# Patient Record
Sex: Male | Born: 1935 | Race: Black or African American | Hispanic: No | Smoking: Never smoker
Health system: Southern US, Community
[De-identification: ages and names within clinical notes are randomized; demographics above are authoritative.]

## PROBLEM LIST (undated history)

## (undated) DIAGNOSIS — E785 Hyperlipidemia, unspecified: Secondary | ICD-10-CM

## (undated) DIAGNOSIS — I639 Cerebral infarction, unspecified: Secondary | ICD-10-CM

## (undated) DIAGNOSIS — I10 Essential (primary) hypertension: Secondary | ICD-10-CM

---

## 2014-06-07 ENCOUNTER — Encounter (HOSPITAL_COMMUNITY): Payer: Self-pay

## 2014-06-07 ENCOUNTER — Observation Stay (HOSPITAL_COMMUNITY)
Admission: RE | Admit: 2014-06-07 | Discharge: 2014-06-08 | Disposition: A | Discharge status: Home / Self Care | Payer: Medicare Other | Source: Ambulatory Visit | Attending: Internal Medicine | Admitting: Internal Medicine

## 2014-06-07 DIAGNOSIS — Z8673 Personal history of transient ischemic attack (TIA), and cerebral infarction without residual deficits: Secondary | ICD-10-CM | POA: Insufficient documentation

## 2014-06-07 DIAGNOSIS — Z7982 Long term (current) use of aspirin: Secondary | ICD-10-CM | POA: Insufficient documentation

## 2014-06-07 DIAGNOSIS — E785 Hyperlipidemia, unspecified: Secondary | ICD-10-CM | POA: Diagnosis not present

## 2014-06-07 DIAGNOSIS — R338 Other retention of urine: Secondary | ICD-10-CM | POA: Insufficient documentation

## 2014-06-07 DIAGNOSIS — N401 Enlarged prostate with lower urinary tract symptoms: Secondary | ICD-10-CM | POA: Diagnosis not present

## 2014-06-07 DIAGNOSIS — I10 Essential (primary) hypertension: Secondary | ICD-10-CM | POA: Insufficient documentation

## 2014-06-07 DIAGNOSIS — I9589 Other hypotension: Secondary | ICD-10-CM | POA: Insufficient documentation

## 2014-06-07 DIAGNOSIS — R55 Syncope and collapse: Principal | ICD-10-CM | POA: Insufficient documentation

## 2014-06-07 HISTORY — DX: Hyperlipidemia, unspecified: E78.5

## 2014-06-07 HISTORY — DX: Cerebral infarction, unspecified: I63.9

## 2014-06-07 HISTORY — DX: Essential (primary) hypertension: I10

## 2014-06-07 LAB — CBC WITH DIFFERENTIAL/PLATELET
Basophils Absolute: 0 10*3/uL (ref 0.0–0.1)
Basophils Relative: 0 % (ref 0–1)
EOS ABS: 0.1 10*3/uL (ref 0.0–0.7)
Eosinophils Relative: 1 % (ref 0–5)
HEMATOCRIT: 34.2 % — AB (ref 39.0–52.0)
HEMOGLOBIN: 11.3 g/dL — AB (ref 13.0–17.0)
LYMPHS ABS: 1.6 10*3/uL (ref 0.7–4.0)
Lymphocytes Relative: 22 % (ref 12–46)
MCH: 28.3 pg (ref 26.0–34.0)
MCHC: 33 g/dL (ref 30.0–36.0)
MCV: 85.7 fL (ref 78.0–100.0)
MONO ABS: 0.4 10*3/uL (ref 0.1–1.0)
MONOS PCT: 5 % (ref 3–12)
NEUTROS PCT: 72 % (ref 43–77)
Neutro Abs: 5 10*3/uL (ref 1.7–7.7)
Platelets: 137 10*3/uL — ABNORMAL LOW (ref 150–400)
RBC: 3.99 MIL/uL — AB (ref 4.22–5.81)
RDW: 14.5 % (ref 11.5–15.5)
WBC: 7.1 10*3/uL (ref 4.0–10.5)

## 2014-06-07 LAB — I-STAT CHEM 8, ED
BUN: 24 mg/dL — AB (ref 6–20)
CALCIUM ION: 1.18 mmol/L (ref 1.13–1.30)
CREATININE: 1 mg/dL (ref 0.61–1.24)
Chloride: 103 mmol/L (ref 101–111)
Glucose, Bld: 139 mg/dL — ABNORMAL HIGH (ref 70–99)
HCT: 39 % (ref 39.0–52.0)
Hemoglobin: 13.3 g/dL (ref 13.0–17.0)
Potassium: 3.6 mmol/L (ref 3.5–5.1)
Sodium: 142 mmol/L (ref 135–145)
TCO2: 22 mmol/L (ref 0–100)

## 2014-06-07 LAB — I-STAT CG4 LACTIC ACID, ED: LACTIC ACID, VENOUS: 2.27 mmol/L — AB (ref 0.5–2.0)

## 2014-06-07 NOTE — ED Notes (Signed)
Unresp, vomited became alert (vomiting after attempting placement of king), 12 lead shows Lbbb, activated stemi, bp 90 initially pale diaphoretic cool to touch, 93.9 temp with ems.

## 2014-06-07 NOTE — ED Notes (Signed)
EKG not crossing over. EKG handed to Dr.

## 2014-06-07 NOTE — ED Provider Notes (Signed)
CSN: [id number]     Arrival date & time 06/07/14  2257 History   None    Chief Complaint  Patient presents with  . Chest Pain     (Consider location/radiation/quality/duration/timing/severity/associated sxs/prior Treatment) HPI 81 yo male presents to the ER via EMS from home as code stemi due to EKG.  Code STEMI cancelled upon arrival.  Per pt and EMS, pt had unresponsive episode.  Pt reports he had finished dinner and was walking back to his room when he felt unwell.  Pt reports he was unable to see or respond, but could hear his daughter.  EMS reports upon their arrival pt was unresponsive, diaphoretic, cool to touch, low bp, had near apnea.  Pt did not respond to bagging, but became responsive with attempted king airway placement, had vomiting.  Pt denies chest pain.  Pt much more responsive in route, currently without complaint.  Pt reports h/o diabetes, htn, bladder troubles.  He reports [facility]  He denies prior heart attack.  He denies recent illlness, fevers, n/v/d. Past Medical History  Diagnosis Date  . Hyperlipemia   . Hypertension   . Stroke    History reviewed. No pertinent past surgical history. History reviewed. No pertinent family history. History  Substance Use Topics  . Smoking status: Not on file  . Smokeless tobacco: Not on file  . Alcohol Use: Not on file    Review of Systems  Neurological: Positive for syncope.  All other systems reviewed and are negative.     Allergies  Review of patient's allergies indicates no known allergies.  Home Medications   Prior to Admission medications   Not on File   Ht 5\' 4"  (1.626 m)  Wt 128 lb (58.06 kg)  BMI 21.96 kg/m2 Physical Exam  Constitutional: He is oriented to person, place, and time. He appears well-developed and well-nourished. No distress.  HENT:  Head: Normocephalic and atraumatic.  Nose: Nose normal.  Mouth/Throat: Oropharynx is clear and moist.  Eyes: Conjunctivae and  EOM are normal. Pupils are equal, round, and reactive to light.  Neck: Normal range of motion. Neck supple. No JVD present. No tracheal deviation present. No thyromegaly present.  Cardiovascular: Normal rate, regular rhythm, normal heart sounds and intact distal pulses.  Exam reveals no gallop and no friction rub.   No murmur heard. Pulmonary/Chest: Effort normal and breath sounds normal. No stridor. No respiratory distress. He has no wheezes. He has no rales. He exhibits no tenderness.  Abdominal: Soft. Bowel sounds are normal. He exhibits no distension and no mass. There is no tenderness. There is no rebound and no guarding.  Musculoskeletal: Normal range of motion. He exhibits no edema or tenderness.  Lymphadenopathy:    He has no cervical adenopathy.  Neurological: He is alert and oriented to person, place, and time. He displays normal reflexes. He exhibits normal muscle tone. Coordination normal.  Skin: Skin is warm and dry. No rash noted. No erythema. No pallor.  Psychiatric: He has a normal mood and affect. His behavior is normal. Judgment and thought content normal.  Nursing note and vitals reviewed.   ED Course  Procedures (including critical care time) Labs Review Labs Reviewed  COMPREHENSIVE METABOLIC PANEL - Abnormal; Notable for the following:    Potassium 3.4 (*)    Glucose, Bld 140 (*)    BUN 21 (*)    Total Protein 6.4 (*)    Albumin 3.3 (*)    ALT  8 (*)    All other components within normal limits  CBC WITH DIFFERENTIAL/PLATELET - Abnormal; Notable for the following:    RBC 3.99 (*)    Hemoglobin 11.3 (*)    HCT 34.2 (*)    Platelets 137 (*)    All other components within normal limits  I-STAT CG4 LACTIC ACID, ED - Abnormal; Notable for the following:    Lactic Acid, Venous 2.27 (*)    All other components within normal limits  I-STAT CHEM 8, ED - Abnormal; Notable for the following:    BUN 24 (*)    Glucose, Bld 139 (*)    All other components within normal  limits  TROPONIN I  URINALYSIS, ROUTINE W REFLEX MICROSCOPIC  BASIC METABOLIC PANEL    Imaging Review Dg Chest Port 1 View  06/08/2014   CLINICAL DATA:  Syncope, dyspnea.  EXAM: PORTABLE CHEST - 1 VIEW  COMPARISON:  None.  FINDINGS: A single AP portable view of the chest demonstrates no focal airspace consolidation or alveolar edema. The lungs are grossly clear. There is no large effusion or pneumothorax. Cardiac and mediastinal contours appear unremarkable.  IMPRESSION: No active disease.   Electronically Signed   By: Ellery Plunkaniel R Mitchell M.D.   On: 06/08/2014 01:49     EKG Interpretation   Date/Time:  Friday Jun 07 2014 23:14:12 EDT Ventricular Rate:  75 PR Interval:  220 QRS Duration: 150 QT Interval:  470 QTC Calculation: 524 R Axis:   83 Text Interpretation:  Sinus rhythm with 1st degree A-V block Left  ventricular hypertrophy with QRS widening and repolarization abnormality  Abnormal ECG No old tracing to compare Confirmed by Akon Reinoso  MD, Mariafernanda Hendricksen  ([id number]) on 06/07/2014 11:16:26 PM      MDM   Final diagnoses:  Syncope, unspecified syncope type    81 yo male with unresponsive episode, now back to baseline.  Ddx includes syncope, seizure, TIA, arrhythmia.  Plan for labs, ekg, further monitoring  2:21 AM Pt to be admitted.  Per family, pt with prolonged unresponsive episode after dinner.  [facility], started on flomax for BPH, had admit for pna 2 months ago.  Recently started on lisinopril for htn.  Low bps here, but responding to fluids.    Marisa Severinlga Lannie Yusuf, MD 06/08/14 Earle [id number]

## 2014-06-07 NOTE — ED Notes (Signed)
The pts family report a syncopal  Episode just before bedtime.  [facility].  He just became weak while walking to the bedroom.  His eyes rolled backward and he was noit responding and sweating.  He responded when ems arrived and attempted to intubate him  He vomited then started talking.  Alert skin warm and dry.  No pain anywhere.  C/o being cold warm blankets  placed

## 2014-06-08 ENCOUNTER — Ambulatory Visit (HOSPITAL_COMMUNITY): Payer: Medicare Other

## 2014-06-08 DIAGNOSIS — I9589 Other hypotension: Secondary | ICD-10-CM | POA: Diagnosis not present

## 2014-06-08 DIAGNOSIS — R55 Syncope and collapse: Secondary | ICD-10-CM | POA: Diagnosis present

## 2014-06-08 DIAGNOSIS — I10 Essential (primary) hypertension: Secondary | ICD-10-CM | POA: Diagnosis not present

## 2014-06-08 LAB — BASIC METABOLIC PANEL
ANION GAP: 5 (ref 5–15)
BUN: 20 mg/dL (ref 6–20)
CO2: 27 mmol/L (ref 22–32)
Calcium: 8.7 mg/dL — ABNORMAL LOW (ref 8.9–10.3)
Chloride: 108 mmol/L (ref 101–111)
Creatinine, Ser: 1 mg/dL (ref 0.61–1.24)
GFR calc Af Amer: >60 mL/min (ref >60)
Glucose, Bld: 108 mg/dL — ABNORMAL HIGH (ref 70–99)
Potassium: 3.9 mmol/L (ref 3.5–5.1)
Sodium: 140 mmol/L (ref 135–145)

## 2014-06-08 LAB — COMPREHENSIVE METABOLIC PANEL
ALK PHOS: 62 U/L (ref 38–126)
ALT: 8 U/L — ABNORMAL LOW (ref 17–63)
ANION GAP: 10 (ref 5–15)
AST: 22 U/L (ref 15–41)
Albumin: 3.3 g/dL — ABNORMAL LOW (ref 3.5–5.0)
BUN: 21 mg/dL — AB (ref 6–20)
CHLORIDE: 103 mmol/L (ref 101–111)
CO2: 24 mmol/L (ref 22–32)
CREATININE: 1.04 mg/dL (ref 0.61–1.24)
Calcium: 9.2 mg/dL (ref 8.9–10.3)
GFR calc Af Amer: >60 mL/min (ref >60)
GFR calc non Af Amer: >60 mL/min (ref >60)
GLUCOSE: 140 mg/dL — AB (ref 70–99)
POTASSIUM: 3.4 mmol/L — AB (ref 3.5–5.1)
Sodium: 137 mmol/L (ref 135–145)
Total Bilirubin: 0.6 mg/dL (ref 0.3–1.2)
Total Protein: 6.4 g/dL — ABNORMAL LOW (ref 6.5–8.1)

## 2014-06-08 LAB — URINALYSIS, ROUTINE W REFLEX MICROSCOPIC
Bilirubin Urine: NEGATIVE
Glucose, UA: NEGATIVE mg/dL
HGB URINE DIPSTICK: NEGATIVE
Ketones, ur: 15 mg/dL — AB
Leukocytes, UA: NEGATIVE
NITRITE: NEGATIVE
PROTEIN: NEGATIVE mg/dL
Specific Gravity, Urine: 1.024 (ref 1.005–1.030)
UROBILINOGEN UA: 0.2 mg/dL (ref 0.0–1.0)
pH: 5.5 (ref 5.0–8.0)

## 2014-06-08 LAB — TROPONIN I: Troponin I: 0.03 ng/mL (ref <0.031)

## 2014-06-08 MED ORDER — PANTOPRAZOLE SODIUM 40 MG PO TBEC: 40.0000 mg | DELAYED_RELEASE_TABLET | Freq: Every day | ORAL | Status: DC

## 2014-06-08 MED ORDER — DOCUSATE SODIUM 100 MG PO CAPS: 200.0000 mg | ORAL_CAPSULE | Freq: Two times a day (BID) | ORAL | Status: DC

## 2014-06-08 MED ORDER — LISINOPRIL 20 MG PO TABS
20.0000 mg | ORAL_TABLET | Freq: Every day | ORAL | Status: DC
  Filled 2014-06-08: qty 1

## 2014-06-08 MED ORDER — LORATADINE 10 MG PO TABS
10.0000 mg | ORAL_TABLET | Freq: Every day | ORAL | Status: DC
  Filled 2014-06-08: qty 1

## 2014-06-08 MED ORDER — BISACODYL 5 MG PO TBEC
5.0000 mg | DELAYED_RELEASE_TABLET | Freq: Every day | ORAL | Status: DC
  Filled 2014-06-08: qty 1

## 2014-06-08 MED ORDER — MIRTAZAPINE 15 MG PO TABS
15.0000 mg | ORAL_TABLET | Freq: Every day | ORAL | Status: DC
  Filled 2014-06-08: qty 1

## 2014-06-08 MED ORDER — ADULT MULTIVITAMIN W/MINERALS CH
1.0000 | ORAL_TABLET | Freq: Every day | ORAL | Status: DC
  Filled 2014-06-08: qty 1

## 2014-06-08 MED ORDER — SODIUM CHLORIDE 0.9 % IV BOLUS (SEPSIS)
1000.0000 mL | Freq: Once | INTRAVENOUS | Status: AC
  Administered 2014-06-08: 1000 mL via INTRAVENOUS

## 2014-06-08 MED ORDER — SODIUM CHLORIDE 0.9 % IJ SOLN: 3.0000 mL | Freq: Two times a day (BID) | INTRAMUSCULAR | Status: DC

## 2014-06-08 MED ORDER — ASPIRIN 325 MG PO TABS
325.0000 mg | ORAL_TABLET | Freq: Every day | ORAL | Status: DC
  Filled 2014-06-08: qty 1

## 2014-06-08 MED ORDER — ONDANSETRON HCL 4 MG/2ML IJ SOLN
4.0000 mg | Freq: Once | INTRAMUSCULAR | Status: AC
  Administered 2014-06-08: 4 mg via INTRAVENOUS
  Filled 2014-06-08: qty 2

## 2014-06-08 MED ORDER — ACETAMINOPHEN 325 MG PO TABS: 650.0000 mg | ORAL_TABLET | Freq: Every day | ORAL | Status: DC

## 2014-06-08 MED ORDER — HEPARIN SODIUM (PORCINE) 5000 UNIT/ML IJ SOLN
5000.0000 [IU] | Freq: Three times a day (TID) | INTRAMUSCULAR | Status: DC
  Administered 2014-06-08: 5000 [IU] via SUBCUTANEOUS

## 2014-06-08 NOTE — H&P (Signed)
[facility] History and Physical  Raymond GuerinWilliam Estis ZOX:[id number] DOB: 05/28/1920 DOA: 06/07/2014  Referring physician: EDP PCP: No primary care provider on file.   Chief Complaint: Syncope   HPI: Raymond GuerinWilliam Davis is a 81 y.o. male who spent the entire day today [facility].  He was started on Terazosin today for BPH and urinary retention.  He took the medication this evening, and 2 hours later, became light headed, sweaty, and passed out at home.  EMS arrived, he was initially unresponsive, they tried to intubate him, he woke up during intubation attempt, vomited, and became responsive and awake, alert, and talking.  He was brought to the ED, where his BP was found to be very low, and has remained persistently ~ 88-90 systolic / 50 diastolic.  Review of Systems: Systems reviewed.  As above, otherwise negative  Past Medical History  Diagnosis Date  . Hyperlipemia   . Hypertension   . Stroke    History reviewed. No pertinent past surgical history. Social History:  has no tobacco, alcohol, and drug history on file.  No Known Allergies  History reviewed. No pertinent family history.   Prior to Admission medications   Medication Sig Start Date End Date Taking? Authorizing Provider  acetaminophen (TYLENOL) 325 MG tablet Take 650 mg by mouth daily.   Yes Historical Provider, MD  aspirin 325 MG tablet Take 325 mg by mouth daily.   Yes Historical Provider, MD  bisacodyl (DULCOLAX) 5 MG EC tablet Take 5 mg by mouth daily.   Yes Historical Provider, MD  docusate sodium (COLACE) 100 MG capsule Take 200 mg by mouth 2 (two) times daily.   Yes Historical Provider, MD  lisinopril (PRINIVIL,ZESTRIL) 40 MG tablet Take 20 mg by mouth daily.   Yes Historical Provider, MD  mirtazapine (REMERON) 15 MG tablet Take 15 mg by mouth at bedtime.   Yes Historical Provider, MD  Multiple Vitamin (MULTIVITAMIN WITH MINERALS) TABS tablet Take 1 tablet by mouth daily.   Yes Historical  Provider, MD  omeprazole (PRILOSEC) 20 MG capsule Take 20 mg by mouth daily.   Yes Historical Provider, MD   Physical Exam: Filed Vitals:   06/08/14 0045  BP: 88/63  Pulse: 80  Temp:   Resp: 13    BP 88/63 mmHg  Pulse 80  Temp(Src) 97.3 F (36.3 C) (Oral)  Resp 13  Ht 5\' 4"  (1.626 m)  Wt 58.06 kg (128 lb)  BMI 21.96 kg/m2  SpO2 100%  General Appearance:    Alert, oriented, no distress, appears stated age  Head:    Normocephalic, atraumatic  Eyes:    PERRL, EOMI, sclera non-icteric        Nose:   Nares without drainage or epistaxis. Mucosa, turbinates normal  Throat:   Moist mucous membranes. Oropharynx without erythema or exudate.  Neck:   Supple. No carotid bruits.  No thyromegaly.  No lymphadenopathy.   Back:     No CVA tenderness, no spinal tenderness  Lungs:     Clear to auscultation bilaterally, without wheezes, rhonchi or rales  Chest wall:    No tenderness to palpitation  Heart:    Regular rate and rhythm without murmurs, gallops, rubs  Abdomen:     Soft, non-tender, nondistended, normal bowel sounds, no organomegaly  Genitalia:    deferred  Rectal:    deferred  Extremities:   No clubbing, cyanosis or edema.  Pulses:   2+ and symmetric all extremities  Skin:  Skin color, texture, turgor normal, no rashes or lesions  Lymph nodes:   Cervical, supraclavicular, and axillary nodes normal  Neurologic:   CNII-XII intact. Normal strength, sensation and reflexes      throughout    Labs on Admission:  Basic Metabolic Panel:  Recent Labs Lab 06/07/14 2326 06/07/14 2334  NA 137 142  K 3.4* 3.6  CL 103 103  CO2 24  --   GLUCOSE 140* 139*  BUN 21* 24*  CREATININE 1.04 1.00  CALCIUM 9.2  --    Liver Function Tests:  Recent Labs Lab 06/07/14 2326  AST 22  ALT 8*  ALKPHOS 62  BILITOT 0.6  PROT 6.4*  ALBUMIN 3.3*   No results for input(s): LIPASE, AMYLASE in the last 168 hours. No results for input(s): AMMONIA in the last 168 hours. CBC:  Recent  Labs Lab 06/07/14 2326 06/07/14 2334  WBC 7.1  --   NEUTROABS 5.0  --   HGB 11.3* 13.3  HCT 34.2* 39.0  MCV 85.7  --   PLT 137*  --    Cardiac Enzymes:  Recent Labs Lab 06/07/14 2326  TROPONINI 0.03    BNP (last 3 results) No results for input(s): PROBNP in the last 8760 hours. CBG: No results for input(s): GLUCAP in the last 168 hours.  Radiological Exams on Admission: Dg Chest Port 1 View  06/08/2014   CLINICAL DATA:  Syncope, dyspnea.  EXAM: PORTABLE CHEST - 1 VIEW  COMPARISON:  None.  FINDINGS: A single AP portable view of the chest demonstrates no focal airspace consolidation or alveolar edema. The lungs are grossly clear. There is no large effusion or pneumothorax. Cardiac and mediastinal contours appear unremarkable.  IMPRESSION: No active disease.   Electronically Signed   By: Ellery Plunkaniel R Mitchell M.D.   On: 06/08/2014 01:49    EKG: Independently reviewed.  Assessment/Plan Principal Problem:   Doxazosin-induced syncope Active Problems:   Iatrogenic hypotension   HTN (hypertension)   1. Terazosin-induced syncope - Terazosin prescribed to patient to attempt to treat BPH, unfortunatly after taking first pill earlier this evening, has had syncopal episode and is persistently hypotensive (normally hypertensive at baseline requiring max dose lisinopril at baseline). 1. Getting IVF in ED, goal MAP >= 65. 2. Discontinuing terazosin and holding lisinopril 3. If alpha blocker is desired in future, will need to stop lisinopril first and use a lower dose of alpha blocker with extreme caution. 4. Repeat BMP in AM to monitor kidney function in light of hypotension. 5. Strict intake and output 2. HTN - presently hypotensive, stopping BP meds    Code Status: Full Code  Family Communication: Family at bedside Disposition Plan: Admit to obs   Time spent: 70 min  Gordon Carlson M. [facility] Pager [phone]  If 7AM-7PM, please contact the day team taking care of the  patient [email or web address] Password [email or web address] 06/08/2014, 1:51 AM

## 2014-06-08 NOTE — Progress Notes (Signed)
Discharged to home with family office visits in place teaching done  

## 2014-06-08 NOTE — ED Notes (Signed)
Report given to

## 2014-06-08 NOTE — Progress Notes (Signed)
Pt. Walked 26200ft. tolerated well

## 2014-06-08 NOTE — ED Notes (Signed)
Admitting doctor  Seeing the pt

## 2014-06-08 NOTE — ED Notes (Signed)
Pt c/o nausea. Med given. 

## 2014-06-08 NOTE — Discharge Summary (Addendum)
Physician Discharge Summary  Raymond GuerinWilliam Deroos ZOX:[id number] DOB: 04/25/1920 DOA: 06/07/2014  PCP: No PCP Per Patient  Admit date: 06/07/2014 Discharge date: 06/08/2014  Time spent: 50 minutes  Recommendations for Outpatient Follow-up:  [facility]. [facility]  Discharge Condition: Stable Diet recommendation: Sodium heart healthy  Discharge Diagnoses:  Principal Problem:   Syncope and collapse Active Problems:   Iatrogenic hypotension   HTN (hypertension)   History of present illness:  Raymond Davis is a 81 y.o. male who spent the entire day today getting routine tests done at the [facility]. He was started on Terazosin  for BPH.Marland Kitchen. He took the medication on the evening of admission and 2 hours later, became light headed, sweaty, and passed out at home. EMS arrived, he was initially unresponsive, they tried to intubate him, he woke up during intubation attempt, vomited, and became responsive and awake, alert, and talking. He was brought to the ED, where his BP was found to be very low, and has remained persistently ~ 88-90 systolic / 50 diastolic.  Hospital Course:  Syncope, orthostatic hypotension- history of hypertension -In relation to above-mentioned use of terazosin-blood pressure this morning is actually elevated and orthostatic vitals are negative -Lab work is consistent with dehydration with an elevated BUN and creatinine ratio-he has received close to 2 L of fluids since admission and is voiding well-as mentioned his orthostatic vitals are negative and at this point I do not feel he needs any further hydration -He and his daughter who is at bedside are recommended to discontinue Terazosin completely and follow up at the [facility] to readdress his BPH issue- his daughter is aware that she will need to take records of this hospital stay with her when she follows up - lisinopril has been resumed  Procedures:  none  Consultations:  none  Discharge Exam: Filed Weights    06/07/14 2310 06/08/14 0526  Weight: 58.06 kg (128 lb) 53.7 kg (118 lb 6.2 oz)   Filed Vitals:   06/08/14 0526  BP: 145/59  Pulse: 64  Temp: 97.4 F (36.3 C)  Resp: 20    General: AAO x 3, no distress Cardiovascular: RRR, no murmurs  Respiratory: clear to auscultation bilaterally GI: soft, non-tender, non-distended, bowel sound positive  Discharge Instructions You were cared for by a hospitalist during your hospital stay. If you have any questions about your discharge medications or the care you received while you were in the hospital after you are discharged, you can call the unit and asked to speak with the hospitalist on call if the hospitalist that took care of you is not available. Once you are discharged, your primary care physician will handle any further medical issues. Please note that NO REFILLS for any discharge medications will be authorized once you are discharged, as it is imperative that you return to your primary care physician (or establish a relationship with a primary care physician if you do not have one) for your aftercare needs so that they can reassess your need for medications and monitor your lab values.      Discharge Instructions    Diet - low sodium heart healthy    Complete by:  As directed      Increase activity slowly    Complete by:  As directed             Medication List    TAKE these medications        acetaminophen 325 MG tablet  Commonly known as:  TYLENOL  Take 650 mg by mouth daily.     aspirin 325 MG tablet  Take 325 mg by mouth daily.     bisacodyl 5 MG EC tablet  Commonly known as:  DULCOLAX  Take 5 mg by mouth daily.     docusate sodium 100 MG capsule  Commonly known as:  COLACE  Take 200 mg by mouth 2 (two) times daily.     lisinopril 40 MG tablet  Commonly known as:  PRINIVIL,ZESTRIL  Take 20 mg by mouth daily.     mirtazapine 15 MG tablet  Commonly known as:  REMERON  Take 15 mg by mouth at bedtime.      multivitamin with minerals Tabs tablet  Take 1 tablet by mouth daily.     omeprazole 20 MG capsule  Commonly known as:  PRILOSEC  Take 20 mg by mouth daily.       Allergies  Allergen Reactions  . Terazosin Other (See Comments)    Patient had syncopal episode and persistent hypotension after starting 0.4mg  terazosin for first time.  Use extreme caution if trying to use alpha blocker in future and stop other BP meds first.      The results of significant diagnostics from this hospitalization (including imaging, microbiology, ancillary and laboratory) are listed below for reference.    Significant Diagnostic Studies: Dg Chest Port 1 View  06/08/2014   CLINICAL DATA:  Syncope, dyspnea.  EXAM: PORTABLE CHEST - 1 VIEW  COMPARISON:  None.  FINDINGS: A single AP portable view of the chest demonstrates no focal airspace consolidation or alveolar edema. The lungs are grossly clear. There is no large effusion or pneumothorax. Cardiac and mediastinal contours appear unremarkable.  IMPRESSION: No active disease.   Electronically Signed   By: Ellery Plunkaniel R Mitchell M.D.   On: 06/08/2014 01:49    Microbiology: No results found for this or any previous visit (from the past 240 hour(s)).   Labs: Basic Metabolic Panel:  Recent Labs Lab 06/07/14 2326 06/07/14 2334 06/08/14 0343  NA 137 142 140  K 3.4* 3.6 3.9  CL 103 103 108  CO2 24  --  27  GLUCOSE 140* 139* 108*  BUN 21* 24* 20  CREATININE 1.04 1.00 1.00  CALCIUM 9.2  --  8.7*   Liver Function Tests:  Recent Labs Lab 06/07/14 2326  AST 22  ALT 8*  ALKPHOS 62  BILITOT 0.6  PROT 6.4*  ALBUMIN 3.3*   No results for input(s): LIPASE, AMYLASE in the last 168 hours. No results for input(s): AMMONIA in the last 168 hours. CBC:  Recent Labs Lab 06/07/14 2326 06/07/14 2334  WBC 7.1  --   NEUTROABS 5.0  --   HGB 11.3* 13.3  HCT 34.2* 39.0  MCV 85.7  --   PLT 137*  --    Cardiac Enzymes:  Recent Labs Lab 06/07/14 2326   TROPONINI 0.03   BNP: BNP (last 3 results) No results for input(s): BNP in the last 8760 hours.  ProBNP (last 3 results) No results for input(s): PROBNP in the last 8760 hours.  CBG: No results for input(s): GLUCAP in the last 168 hours.     SignedCalvert Cantor:  Alane Hanssen, MD [facility] 06/08/2014, 12:33 PM

## 2015-02-04 ENCOUNTER — Encounter: Payer: Self-pay | Admitting: Sports Medicine

## 2015-02-04 ENCOUNTER — Ambulatory Visit (INDEPENDENT_AMBULATORY_CARE_PROVIDER_SITE_OTHER): Payer: Medicare Other | Admitting: Sports Medicine

## 2015-02-04 DIAGNOSIS — M79672 Pain in left foot: Secondary | ICD-10-CM | POA: Diagnosis not present

## 2015-02-04 DIAGNOSIS — B351 Tinea unguium: Secondary | ICD-10-CM | POA: Diagnosis not present

## 2015-02-04 DIAGNOSIS — M79671 Pain in right foot: Secondary | ICD-10-CM | POA: Diagnosis not present

## 2015-02-04 DIAGNOSIS — M2040 Other hammer toe(s) (acquired), unspecified foot: Secondary | ICD-10-CM | POA: Diagnosis not present

## 2015-02-04 DIAGNOSIS — M21619 Bunion of unspecified foot: Secondary | ICD-10-CM

## 2015-02-04 NOTE — Progress Notes (Signed)
Patient ID: Raymond Davis, male   DOB: 06/04/1920, 82 y.o.   MRN: [id number] Subjective: Raymond Davis is a 82 y.o. male patient seen today in office with complaint of painful thickened and elongated toenails; unable to trim. Patient is assisted by son who denies history of Diabetes, Neuropathy, or Vascular disease. Admits to previous right sided stroke in past. Patient has no other pedal complaints at this time.   Patient Active Problem List   Diagnosis Date Noted  . Iatrogenic hypotension 06/08/2014  . Syncope and collapse 06/08/2014  . HTN (hypertension) 06/08/2014   Current Outpatient Prescriptions on File Prior to Visit  Medication Sig Dispense Refill  . acetaminophen (TYLENOL) 325 MG tablet Take 650 mg by mouth daily.    Marland Kitchen. aspirin 325 MG tablet Take 325 mg by mouth daily.    . bisacodyl (DULCOLAX) 5 MG EC tablet Take 5 mg by mouth daily.    Marland Kitchen. docusate sodium (COLACE) 100 MG capsule Take 200 mg by mouth 2 (two) times daily.    Marland Kitchen. lisinopril (PRINIVIL,ZESTRIL) 40 MG tablet Take 20 mg by mouth daily.    . mirtazapine (REMERON) 15 MG tablet Take 15 mg by mouth at bedtime.    . Multiple Vitamin (MULTIVITAMIN WITH MINERALS) TABS tablet Take 1 tablet by mouth daily.    Marland Kitchen. omeprazole (PRILOSEC) 20 MG capsule Take 20 mg by mouth daily.     No current facility-administered medications on file prior to visit.   Allergies  Allergen Reactions  . Terazosin Other (See Comments)    Patient had syncopal episode and persistent hypotension after starting 0.4mg  terazosin for first time.  Use extreme caution if trying to use alpha blocker in future and stop other BP meds first.   Objective: Physical Exam  General: Well developed, nourished, no acute distress, awake, alert and oriented x 3  Vascular: Dorsalis pedis artery 1/4 bilateral, Posterior tibial artery 1/4 bilateral, skin temperature warm to warm proximal to distal bilateral lower extremities, no varicosities, scant pedal hair present  bilateral.  Neurological: Gross sensation present via light touch bilateral. Decreased vibratory and protective with SWMF bilateral.  Dermatological: Skin is warm, dry, and supple bilateral, Nails 1-10 are tender, long, thick, and discolored with mild subungal debris, no webspace macerations present bilateral, no open lesions present bilateral, no callus/corns/hyperkeratotic tissue present bilateral. No signs of infection bilateral.  Musculoskeletal:Asymptoatic bunion and hammertoe deformities noted bilateral. Muscular strength within normal limits without gross pain or major limitation on range of motion. No pain with calf compression bilateral.  Assessment and Plan:  Problem List Items Addressed This Visit    None    Visit Diagnoses    Dermatophytosis of nail    -  Primary    Foot pain, bilateral        Bunion        Hammer toe, unspecified laterality          -Examined patient.  -Discussed treatment options for painful mycotic nails. -Mechanically debrided and reduced mycotic nails with sterile nail nipper and dremel nail file without incident. -Recommend good supportive shoes daily for foot type and gentle toe stretches as needed -Patient to return in 3 months for follow up evaluation or sooner if symptoms worsen.  Asencion Islamitorya Shanta Dorvil, DPM

## 2015-05-06 ENCOUNTER — Ambulatory Visit: Payer: Medicare Other | Admitting: Sports Medicine

## 2015-05-09 ENCOUNTER — Ambulatory Visit (INDEPENDENT_AMBULATORY_CARE_PROVIDER_SITE_OTHER): Payer: Medicare Other | Admitting: Sports Medicine

## 2015-05-09 ENCOUNTER — Encounter: Payer: Self-pay | Admitting: Sports Medicine

## 2015-05-09 DIAGNOSIS — M79671 Pain in right foot: Secondary | ICD-10-CM | POA: Diagnosis not present

## 2015-05-09 DIAGNOSIS — M21619 Bunion of unspecified foot: Secondary | ICD-10-CM | POA: Diagnosis not present

## 2015-05-09 DIAGNOSIS — G629 Polyneuropathy, unspecified: Secondary | ICD-10-CM

## 2015-05-09 DIAGNOSIS — B351 Tinea unguium: Secondary | ICD-10-CM | POA: Diagnosis not present

## 2015-05-09 DIAGNOSIS — M79672 Pain in left foot: Secondary | ICD-10-CM

## 2015-05-09 DIAGNOSIS — M2040 Other hammer toe(s) (acquired), unspecified foot: Secondary | ICD-10-CM | POA: Diagnosis not present

## 2015-05-09 DIAGNOSIS — I739 Peripheral vascular disease, unspecified: Secondary | ICD-10-CM

## 2015-05-09 NOTE — Progress Notes (Signed)
Patient ID: Raymond Davis, male   DOB: 02/20/1920, 82 y.o.   MRN: [id number]  Subjective: Raymond Davis is a 82 y.o. male patient seen today in office with complaint of painful thickened and elongated toenails; unable to trim. Patient denies any changes since last visit. Patient has no other pedal complaints at this time.   Patient Active Problem List   Diagnosis Date Noted  . Iatrogenic hypotension 06/08/2014  . Syncope and collapse 06/08/2014  . HTN (hypertension) 06/08/2014   Current Outpatient Prescriptions on File Prior to Visit  Medication Sig Dispense Refill  . acetaminophen (TYLENOL) 325 MG tablet Take 650 mg by mouth daily.    Marland Kitchen. aspirin 325 MG tablet Take 325 mg by mouth daily.    . bisacodyl (DULCOLAX) 5 MG EC tablet Take 5 mg by mouth daily.    Marland Kitchen. docusate sodium (COLACE) 100 MG capsule Take 200 mg by mouth 2 (two) times daily.    Marland Kitchen. lisinopril (PRINIVIL,ZESTRIL) 40 MG tablet Take 20 mg by mouth daily.    . mirtazapine (REMERON) 15 MG tablet Take 15 mg by mouth at bedtime.    . Multiple Vitamin (MULTIVITAMIN WITH MINERALS) TABS tablet Take 1 tablet by mouth daily.    Marland Kitchen. omeprazole (PRILOSEC) 20 MG capsule Take 20 mg by mouth daily.     No current facility-administered medications on file prior to visit.   Allergies  Allergen Reactions  . Terazosin Other (See Comments)    Patient had syncopal episode and persistent hypotension after starting 0.4mg  terazosin for first time.  Use extreme caution if trying to use alpha blocker in future and stop other BP meds first.   Objective: Physical Exam  General: Well developed, nourished, no acute distress, awake, alert and oriented x 3  Vascular: Dorsalis pedis artery 1/4 bilateral, Posterior tibial artery 1/4 bilateral, skin temperature warm to warm proximal to distal bilateral lower extremities, no varicosities, +1 pitting edema bilateral, scant pedal hair present bilateral.  Neurological: Gross sensation present via light touch  bilateral. Decreased vibratory and protective with SWMF bilateral.  Dermatological: Skin is warm, dry, and supple bilateral, Nails 1-10 are tender, long, thick, and discolored with mild subungal debris, no webspace macerations present bilateral, no open lesions present bilateral, no callus/corns/hyperkeratotic tissue present bilateral. No signs of infection bilateral.  Musculoskeletal:Asymptoatic bunion and hammertoe deformities noted bilateral. Muscular strength within normal limits without gross pain or major limitation on range of motion. No pain with calf compression bilateral.  Assessment and Plan:  Problem List Items Addressed This Visit    None    Visit Diagnoses    Dermatophytosis of nail    -  Primary    Foot pain, bilateral        Bunion        Hammer toe, unspecified laterality        PVD (peripheral vascular disease) (HCC)        Neuropathy (HCC)          -Examined patient.  -Discussed treatment options for painful mycotic nails. -Mechanically debrided and reduced mycotic nails with sterile nail nipper and dremel nail file without incident. -Recommend good supportive shoes daily for foot type and gentle toe stretches as needed and lower leg elevation to assist with edema control -Patient to return in 3 months for follow up evaluation or sooner if symptoms worsen.  Asencion Islamitorya Deette Revak, DPM

## 2015-08-08 ENCOUNTER — Encounter: Payer: Self-pay | Admitting: Sports Medicine

## 2015-08-08 ENCOUNTER — Ambulatory Visit (INDEPENDENT_AMBULATORY_CARE_PROVIDER_SITE_OTHER): Payer: Medicare Other | Admitting: Sports Medicine

## 2015-08-08 DIAGNOSIS — B351 Tinea unguium: Secondary | ICD-10-CM | POA: Diagnosis not present

## 2015-08-08 DIAGNOSIS — M2040 Other hammer toe(s) (acquired), unspecified foot: Secondary | ICD-10-CM

## 2015-08-08 DIAGNOSIS — L97921 Non-pressure chronic ulcer of unspecified part of left lower leg limited to breakdown of skin: Secondary | ICD-10-CM

## 2015-08-08 DIAGNOSIS — M79676 Pain in unspecified toe(s): Secondary | ICD-10-CM

## 2015-08-08 DIAGNOSIS — M21619 Bunion of unspecified foot: Secondary | ICD-10-CM

## 2015-08-08 DIAGNOSIS — M79671 Pain in right foot: Secondary | ICD-10-CM

## 2015-08-08 DIAGNOSIS — G629 Polyneuropathy, unspecified: Secondary | ICD-10-CM

## 2015-08-08 DIAGNOSIS — I739 Peripheral vascular disease, unspecified: Secondary | ICD-10-CM

## 2015-08-08 DIAGNOSIS — M79672 Pain in left foot: Secondary | ICD-10-CM

## 2015-08-08 NOTE — Patient Instructions (Signed)
Do not put icy hot on left leg ulcer May use Neosporin or triple antibiotic cream and cover area with bandaid as needed

## 2015-08-09 NOTE — Progress Notes (Signed)
Patient ID: Raymond Davis, male   DOB: 04/06/1920, 82 y.o.   MRN: [id number]  Subjective: Raymond GuerinWilliam Davis is a 82 y.o. male patient seen today in office with complaint of painful thickened and elongated toenails; unable to trim. Patient denies any changes since last visit. Patient has no other pedal complaints at this time.   Patient is assisted by daughter in law who has noticed a sore on her father-in-law left leg however per patient it has been there and he is getting care for it by PCP.  Patient Active Problem List   Diagnosis Date Noted  . Iatrogenic hypotension 06/08/2014  . Syncope and collapse 06/08/2014  . HTN (hypertension) 06/08/2014   Current Outpatient Prescriptions on File Prior to Visit  Medication Sig Dispense Refill  . acetaminophen (TYLENOL) 325 MG tablet Take 650 mg by mouth daily.    Marland Kitchen. aspirin 325 MG tablet Take 325 mg by mouth daily.    . bisacodyl (DULCOLAX) 5 MG EC tablet Take 5 mg by mouth daily.    Marland Kitchen. docusate sodium (COLACE) 100 MG capsule Take 200 mg by mouth 2 (two) times daily.    Marland Kitchen. lisinopril (PRINIVIL,ZESTRIL) 40 MG tablet Take 20 mg by mouth daily.    . mirtazapine (REMERON) 15 MG tablet Take 15 mg by mouth at bedtime.    . Multiple Vitamin (MULTIVITAMIN WITH MINERALS) TABS tablet Take 1 tablet by mouth daily.    Marland Kitchen. omeprazole (PRILOSEC) 20 MG capsule Take 20 mg by mouth daily.     No current facility-administered medications on file prior to visit.   Allergies  Allergen Reactions  . Terazosin Other (See Comments)    Patient had syncopal episode and persistent hypotension after starting 0.4mg  terazosin for first time.  Use extreme caution if trying to use alpha blocker in future and stop other BP meds first.   Objective: Physical Exam  General: Well developed, nourished, no acute distress, awake, alert and oriented x 3  Vascular: Dorsalis pedis artery 1/4 bilateral, Posterior tibial artery 1/4 bilateral, skin temperature warm to warm proximal to  distal bilateral lower extremities, no varicosities, +1 pitting edema bilateral, scant pedal hair present bilateral.  Neurological: Gross sensation present via light touch bilateral. Decreased vibratory and protective with SWMF bilateral.  Dermatological: Skin is warm, dry, and supple bilateral, Nails 1-10 are tender, long, thick, and discolored with mild subungal debris, no webspace macerations present bilateral,+ partial thickness ulceration at the left leg <2cm with fibrotic base with no acute signs of infection that is currently being cared for by PCP and patient also states that he has been rubbing icy hot to area, no callus/corns/hyperkeratotic tissue present bilateral. No signs of infection bilateral.  Musculoskeletal:Asymptoatic bunion and hammertoe deformities noted bilateral. Muscular strength within normal limits without gross pain or major limitation on range of motion. No pain with calf compression bilateral.  Assessment and Plan:  Problem List Items Addressed This Visit    None    Visit Diagnoses    Dermatophytosis of nail    -  Primary    Foot pain, bilateral        Bunion        Hammer toe, unspecified laterality        PVD (peripheral vascular disease) (HCC)        Neuropathy (HCC)        Leg ulcer, left, limited to breakdown of skin [facility](HCC)        [facility]      -  Examined patient.  -Discussed treatment options for painful mycotic nails. -Mechanically debrided and reduced mycotic nails with sterile nail nipper and dremel nail file without incident. -Recommend good supportive shoes daily for foot type and gentle toe stretches as needed and lower leg elevation to assist with edema control -Recommend continued follow up with PCP for left leg ulceration and advised patient to keep covered during the day and to use antibiotic cream and NOT icy hot to the area -Patient to return in 3 months for follow up evaluation or sooner if symptoms worsen.  Asencion Islam, DPM

## 2015-11-18 ENCOUNTER — Ambulatory Visit (INDEPENDENT_AMBULATORY_CARE_PROVIDER_SITE_OTHER): Payer: Medicare Other | Admitting: Podiatry

## 2015-11-18 DIAGNOSIS — M79676 Pain in unspecified toe(s): Secondary | ICD-10-CM | POA: Diagnosis not present

## 2015-11-18 DIAGNOSIS — L603 Nail dystrophy: Secondary | ICD-10-CM

## 2015-11-18 DIAGNOSIS — B351 Tinea unguium: Secondary | ICD-10-CM

## 2015-11-18 DIAGNOSIS — L608 Other nail disorders: Secondary | ICD-10-CM

## 2015-11-18 DIAGNOSIS — M79609 Pain in unspecified limb: Principal | ICD-10-CM

## 2015-11-18 NOTE — Progress Notes (Signed)
SUBJECTIVE Patient  presents to office today complaining of elongated, thickened nails. Pain while ambulating in shoes. Patient is unable to trim their own nails.   OBJECTIVE General Patient is awake, alert, and oriented x 3 and in no acute distress. Derm Skin is dry and supple bilateral. Negative open lesions or macerations. Remaining integument unremarkable. Nails are tender, long, thickened and dystrophic with subungual debris, consistent with onychomycosis, 1-5 bilateral. No signs of infection noted. Vasc  DP and PT pedal pulses palpable bilaterally. Temperature gradient within normal limits.  Neuro Epicritic and protective threshold sensation diminished bilaterally.  Musculoskeletal Exam No symptomatic pedal deformities noted bilateral. Muscular strength within normal limits.  ASSESSMENT 1. Onychodystrophic nails 1-5 bilateral with hyperkeratosis of nails.  2. Onychomycosis of nail due to dermatophyte bilateral 3. Pain in foot bilateral  PLAN OF CARE 1. Patient evaluated today.  2. Instructed to maintain good pedal hygiene and foot care.  3. Mechanical debridement of nails 1-5 bilaterally performed using a nail nipper. Filed with dremel without incident.  4. Return to clinic in 3 mos.    Jeret Goyer M Riyana Biel, DPM    

## 2016-02-15 IMAGING — CR DG CHEST 1V PORT
1 series · 1 of 1 positions shown · non-contrast
Comparison: None.

CLINICAL DATA: Syncope, dyspnea.

EXAM:
PORTABLE CHEST - 1 VIEW

[AP]
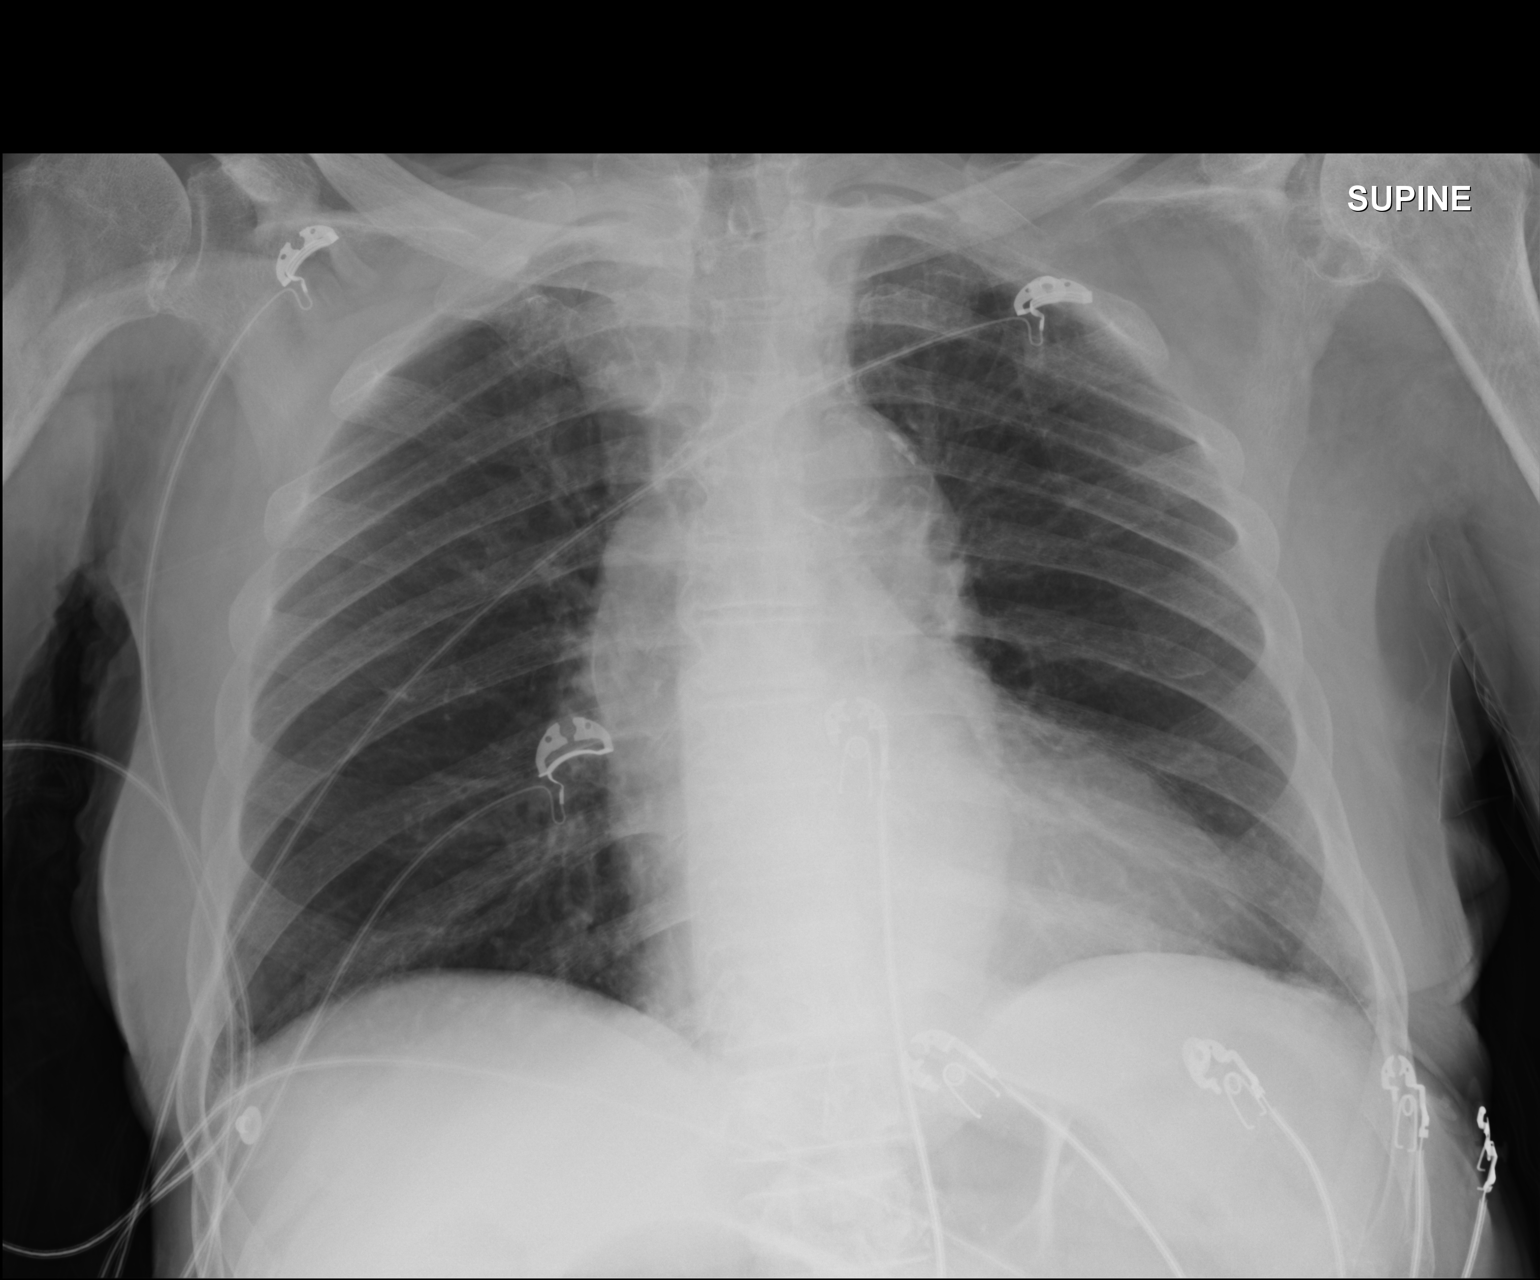

[1 of 1 positions shown; findings below may reference images not displayed]

FINDINGS: A single AP portable view of the chest demonstrates no focal
airspace consolidation or alveolar edema. The lungs are grossly
clear. There is no large effusion or pneumothorax. Cardiac and
mediastinal contours appear unremarkable.
IMPRESSION: No active disease.

## 2016-02-20 ENCOUNTER — Ambulatory Visit: Payer: Medicare Other | Admitting: Podiatry

## 2016-03-01 ENCOUNTER — Ambulatory Visit: Payer: Medicare Other | Admitting: Podiatry

## 2016-03-02 ENCOUNTER — Ambulatory Visit: Payer: Medicare Other | Admitting: Podiatry

## 2018-09-02 DEATH — deceased
